# Patient Record
Sex: Female | Born: 1935 | Race: White | Hispanic: No | Smoking: Never smoker
Health system: Southern US, Community
[De-identification: ages and names within clinical notes are randomized; demographics above are authoritative.]

## PROBLEM LIST (undated history)

## (undated) DIAGNOSIS — K589 Irritable bowel syndrome without diarrhea: Secondary | ICD-10-CM

## (undated) DIAGNOSIS — I10 Essential (primary) hypertension: Secondary | ICD-10-CM

## (undated) DIAGNOSIS — E785 Hyperlipidemia, unspecified: Secondary | ICD-10-CM

## (undated) DIAGNOSIS — K5792 Diverticulitis of intestine, part unspecified, without perforation or abscess without bleeding: Secondary | ICD-10-CM

## (undated) DIAGNOSIS — K5790 Diverticulosis of intestine, part unspecified, without perforation or abscess without bleeding: Secondary | ICD-10-CM

## (undated) DIAGNOSIS — N289 Disorder of kidney and ureter, unspecified: Secondary | ICD-10-CM

## (undated) HISTORY — PX: CHOLECYSTECTOMY: SHX55

## (undated) HISTORY — PX: SHOULDER SURGERY: SHX246

## (undated) HISTORY — PX: ABDOMINAL HYSTERECTOMY: SHX81

## (undated) HISTORY — PX: OTHER SURGICAL HISTORY: SHX169

---

## 2018-01-27 ENCOUNTER — Encounter (HOSPITAL_BASED_OUTPATIENT_CLINIC_OR_DEPARTMENT_OTHER): Payer: Self-pay | Admitting: Adult Health

## 2018-01-27 ENCOUNTER — Emergency Department (HOSPITAL_BASED_OUTPATIENT_CLINIC_OR_DEPARTMENT_OTHER)
Admission: EM | Admit: 2018-01-27 | Discharge: 2018-01-27 | Disposition: A | Discharge status: Home / Self Care | Payer: No Typology Code available for payment source | Attending: Emergency Medicine | Admitting: Emergency Medicine

## 2018-01-27 ENCOUNTER — Other Ambulatory Visit: Payer: Self-pay

## 2018-01-27 ENCOUNTER — Emergency Department (HOSPITAL_BASED_OUTPATIENT_CLINIC_OR_DEPARTMENT_OTHER): Payer: No Typology Code available for payment source

## 2018-01-27 DIAGNOSIS — Y92410 Unspecified street and highway as the place of occurrence of the external cause: Secondary | ICD-10-CM | POA: Diagnosis not present

## 2018-01-27 DIAGNOSIS — S8991XA Unspecified injury of right lower leg, initial encounter: Secondary | ICD-10-CM | POA: Diagnosis present

## 2018-01-27 DIAGNOSIS — I10 Essential (primary) hypertension: Secondary | ICD-10-CM | POA: Insufficient documentation

## 2018-01-27 DIAGNOSIS — Y999 Unspecified external cause status: Secondary | ICD-10-CM | POA: Insufficient documentation

## 2018-01-27 DIAGNOSIS — S8010XA Contusion of unspecified lower leg, initial encounter: Secondary | ICD-10-CM

## 2018-01-27 DIAGNOSIS — S8011XA Contusion of right lower leg, initial encounter: Secondary | ICD-10-CM | POA: Insufficient documentation

## 2018-01-27 DIAGNOSIS — Y9389 Activity, other specified: Secondary | ICD-10-CM | POA: Diagnosis not present

## 2018-01-27 HISTORY — DX: Disorder of kidney and ureter, unspecified: N28.9

## 2018-01-27 HISTORY — DX: Hyperlipidemia, unspecified: E78.5

## 2018-01-27 HISTORY — DX: Essential (primary) hypertension: I10
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
## 2018-01-27 NOTE — Discharge Instructions (Signed)
Make sure you are icing the bruised area and elevating when you can.  Take Tylenol as needed for the pain.

## 2018-01-28 ENCOUNTER — Other Ambulatory Visit: Payer: Self-pay

## 2018-01-28 ENCOUNTER — Encounter (HOSPITAL_BASED_OUTPATIENT_CLINIC_OR_DEPARTMENT_OTHER): Payer: Self-pay | Admitting: Emergency Medicine

## 2018-01-28 ENCOUNTER — Emergency Department (HOSPITAL_BASED_OUTPATIENT_CLINIC_OR_DEPARTMENT_OTHER)
Admission: EM | Admit: 2018-01-28 | Discharge: 2018-01-28 | Disposition: A | Discharge status: Home / Self Care | Payer: No Typology Code available for payment source | Attending: Emergency Medicine | Admitting: Emergency Medicine

## 2018-01-28 DIAGNOSIS — Z79899 Other long term (current) drug therapy: Secondary | ICD-10-CM | POA: Insufficient documentation

## 2018-01-28 DIAGNOSIS — I10 Essential (primary) hypertension: Secondary | ICD-10-CM | POA: Diagnosis not present

## 2018-01-28 DIAGNOSIS — Y939 Activity, unspecified: Secondary | ICD-10-CM | POA: Insufficient documentation

## 2018-01-28 DIAGNOSIS — Y929 Unspecified place or not applicable: Secondary | ICD-10-CM | POA: Diagnosis not present

## 2018-01-28 DIAGNOSIS — Y999 Unspecified external cause status: Secondary | ICD-10-CM | POA: Diagnosis not present

## 2018-01-28 DIAGNOSIS — S39012A Strain of muscle, fascia and tendon of lower back, initial encounter: Secondary | ICD-10-CM | POA: Diagnosis not present

## 2018-01-28 DIAGNOSIS — S3992XA Unspecified injury of lower back, initial encounter: Secondary | ICD-10-CM | POA: Diagnosis present

## 2018-01-28 HISTORY — DX: Irritable bowel syndrome, unspecified: K58.9

## 2018-01-28 HISTORY — DX: Diverticulitis of intestine, part unspecified, without perforation or abscess without bleeding: K57.92

## 2018-01-28 HISTORY — DX: Diverticulosis of intestine, part unspecified, without perforation or abscess without bleeding: K57.90

## 2018-01-28 MED ORDER — METHOCARBAMOL 500 MG PO TABS: 500.0000 mg | ORAL_TABLET | Freq: Two times a day (BID) | ORAL | 0 refills | Status: AC

## 2018-01-28 MED ORDER — DICLOFENAC SODIUM 1 % TD GEL: 2.0000 g | Freq: Four times a day (QID) | TRANSDERMAL | 1 refills | Status: AC
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
